# Patient Record
Sex: Male | Born: 1980 | Race: White | Hispanic: No | Marital: Married | Smoking: Former smoker
Health system: Southern US, Community
[De-identification: ages and names within clinical notes are randomized; demographics above are authoritative.]

## PROBLEM LIST (undated history)

## (undated) DIAGNOSIS — I10 Essential (primary) hypertension: Secondary | ICD-10-CM

## (undated) DIAGNOSIS — F419 Anxiety disorder, unspecified: Secondary | ICD-10-CM

## (undated) HISTORY — DX: Anxiety disorder, unspecified: F41.9

## (undated) HISTORY — DX: Essential (primary) hypertension: I10

---

## 2005-07-22 ENCOUNTER — Emergency Department (HOSPITAL_COMMUNITY): Admission: EM | Admit: 2005-07-22 | Discharge: 2005-07-22 | Payer: Self-pay | Admitting: Emergency Medicine
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
## 2013-05-23 ENCOUNTER — Ambulatory Visit (INDEPENDENT_AMBULATORY_CARE_PROVIDER_SITE_OTHER): Payer: BC Managed Care – PPO | Admitting: Family Medicine

## 2013-05-23 ENCOUNTER — Ambulatory Visit: Payer: Self-pay | Admitting: Family Medicine

## 2013-05-23 ENCOUNTER — Encounter: Payer: Self-pay | Admitting: Family Medicine

## 2013-05-23 ENCOUNTER — Encounter (INDEPENDENT_AMBULATORY_CARE_PROVIDER_SITE_OTHER): Payer: Self-pay

## 2013-05-23 VITALS — BP 134/96 | HR 90 | Temp 98.4°F | Ht 67.0 in | Wt 231.0 lb

## 2013-05-23 DIAGNOSIS — Z0000 Encounter for general adult medical examination without abnormal findings: Secondary | ICD-10-CM

## 2013-05-23 DIAGNOSIS — F411 Generalized anxiety disorder: Secondary | ICD-10-CM

## 2013-05-23 DIAGNOSIS — I10 Essential (primary) hypertension: Secondary | ICD-10-CM

## 2013-05-23 LAB — POCT CBC
Granulocyte percent: 47.4 %G (ref 37–80)
HCT, POC: 47.2 % (ref 43.5–53.7)
Hemoglobin: 15.6 g/dL (ref 14.1–18.1)
Lymph, poc: 2.5 (ref 0.6–3.4)
MCH, POC: 28.4 pg (ref 27–31.2)
MCHC: 33.1 g/dL (ref 31.8–35.4)
MCV: 85.8 fL (ref 80–97)
MPV: 9 fL (ref 0–99.8)
POC Granulocyte: 2.5 (ref 2–6.9)
POC LYMPH PERCENT: 47.5 %L (ref 10–50)
Platelet Count, POC: 149 10*3/uL (ref 142–424)
RBC: 5.5 M/uL (ref 4.69–6.13)
RDW, POC: 12.5 %
WBC: 5.2 10*3/uL (ref 4.6–10.2)

## 2013-05-23 MED ORDER — SERTRALINE HCL 50 MG PO TABS: 50.0000 mg | ORAL_TABLET | Freq: Every day | ORAL | Status: DC

## 2013-05-23 MED ORDER — LISINOPRIL 10 MG PO TABS: 10.0000 mg | ORAL_TABLET | Freq: Every day | ORAL | Status: DC

## 2013-05-23 NOTE — Patient Instructions (Signed)
Stress Stress-related medical problems are becoming increasingly common. The body has a built-in physical response to stressful situations. Faced with pressure, challenge or danger, we need to react quickly. Our bodies release hormones such as cortisol and adrenaline to help do this. These hormones are part of the "fight or flight" response and affect the metabolic rate, heart rate and blood pressure, resulting in a heightened, stressed state that prepares the body for optimum performance in dealing with a stressful situation. It is likely that early man required these mechanisms to stay alive, but usually modern stresses do not call for this, and the same hormones released in today's world can damage health and reduce coping ability. CAUSES  Pressure to perform at work, at school or in sports.  Threats of physical violence.  Money worries.  Arguments.  Family conflicts.  Divorce or separation from significant other.  Bereavement.  New job or unemployment.  Changes in location.  Alcohol or drug abuse. SOMETIMES, THERE IS NO PARTICULAR REASON FOR DEVELOPING STRESS. Almost all people are at risk of being stressed at some time in their lives. It is important to know that some stress is temporary and some is long term.  Temporary stress will go away when a situation is resolved. Most people can cope with short periods of stress, and it can often be relieved by relaxing, taking a walk, chatting through issues with friends, or having a good night's sleep.  Chronic (long-term, continuous) stress is much harder to deal with. It can be psychologically and emotionally damaging. It can be harmful both for an individual and for friends and family. SYMPTOMS Everyone reacts to stress differently. There are some common effects that help us recognize it. In times of extreme stress, people may:  Shake uncontrollably.  Breathe faster and deeper than normal (hyperventilate).  Vomit.  For people  with asthma, stress can trigger an attack.  For some people, stress may trigger migraine headaches, ulcers, and body pain. PHYSICAL EFFECTS OF STRESS MAY INCLUDE:  Loss of energy.  Skin problems.  Aches and pains resulting from tense muscles, including neck ache, backache and tension headaches.  Increased pain from arthritis and other conditions.  Irregular heart beat (palpitations).  Periods of irritability or anger.  Apathy or depression.  Anxiety (feeling uptight or worrying).  Unusual behavior.  Loss of appetite.  Comfort eating.  Lack of concentration.  Loss of, or decreased, sex-drive.  Increased smoking, drinking, or recreational drug use.  For women, missed periods.  Ulcers, joint pain, and muscle pain. Post-traumatic stress is the stress caused by any serious accident, strong emotional damage, or extremely difficult or violent experience such as rape or war. Post-traumatic stress victims can experience mixtures of emotions such as fear, shame, depression, guilt or anger. It may include recurrent memories or images that may be haunting. These feelings can last for weeks, months or even years after the traumatic event that triggered them. Specialized treatment, possibly with medicines and psychological therapies, is available. If stress is causing physical symptoms, severe distress or making it difficult for you to function as normal, it is worth seeing your caregiver. It is important to remember that although stress is a usual part of life, extreme or prolonged stress can lead to other illnesses that will need treatment. It is better to visit a doctor sooner rather than later. Stress has been linked to the development of high blood pressure and heart disease, as well as insomnia and depression. There is no diagnostic test for   stress since everyone reacts to it differently. But a caregiver will be able to spot the physical symptoms, such  as:  Headaches.  Shingles.  Ulcers. Emotional distress such as intense worry, low mood or irritability should be detected when the doctor asks pertinent questions to identify any underlying problems that might be the cause. In case there are physical reasons for the symptoms, the doctor may also want to do some tests to exclude certain conditions. If you feel that you are suffering from stress, try to identify the aspects of your life that are causing it. Sometimes you may not be able to change or avoid them, but even a small change can have a positive ripple effect. A simple lifestyle change can make all the difference. STRATEGIES THAT CAN HELP DEAL WITH STRESS:  Delegating or sharing responsibilities.  Avoiding confrontations.  Learning to be more assertive.  Regular exercise.  Avoid using alcohol or street drugs to cope.  Eating a healthy, balanced diet, rich in fruit and vegetables and proteins.  Finding humor or absurdity in stressful situations.  Never taking on more than you know you can handle comfortably.  Organizing your time better to get as much done as possible.  Talking to friends or family and sharing your thoughts and fears.  Listening to music or relaxation tapes.  Tensing and then relaxing your muscles, starting at the toes and working up to the head and neck. If you think that you would benefit from help, either in identifying the things that are causing your stress or in learning techniques to help you relax, see a caregiver who is capable of helping you with this. Rather than relying on medications, it is usually better to try and identify the things in your life that are causing stress and try to deal with them. There are many techniques of managing stress including counseling, psychotherapy, aromatherapy, yoga, and exercise. Your caregiver can help you determine what is best for you. Document Released: 10/08/2002 Document Revised: 10/10/2011 Document  Reviewed: 09/04/2007 [employer] Patient Information 2014 [employer].  

## 2013-05-23 NOTE — Progress Notes (Signed)
  Subjective:    Patient ID: Daniel Stephens, male    DOB: 07-Jul-1981, 32 y.o.   MRN: [id number]  HPI This 32 y.o. male presents for evaluation of elevated bp and stress levels are high. He finds that he has difficulty relaxing and relives his job duties over and over again. He has been having elevated blood pressure readings.  He has not had labs in awhile and Is due for annual labs   Review of Systems C/o anxiety and stress. No chest pain, SOB, HA, dizziness, vision change, N/V, diarrhea, constipation, dysuria, urinary urgency or frequency, myalgias, arthralgias or rash.     Objective:   Physical Exam Vital signs noted  Well developed well nourished male.  HEENT - Head atraumatic Normocephalic                Eyes - PERRLA, Conjuctiva - clear Sclera- Clear EOMI                Ears - EAC's Wnl TM's Wnl Gross Hearing WNL                Nose - Nares patent                 Throat - oropharanx wnl Respiratory - Lungs CTA bilateral Cardiac - RRR S1 and S2 without murmur GI - Abdomen soft Nontender and bowel sounds active x 4 Extremities - No edema. Neuro - Grossly intact.       Assessment & Plan:  Essential hypertension, benign - Plan: lisinopril (PRINIVIL,ZESTRIL) 10 MG tablet  Anxiety state, unspecified - Plan: sertraline (ZOLOFT) 50 MG tablet  Routine general medical examination at a health care facility - Plan: POCT CBC, Lipid panel, CMP14+EGFR, Thyroid Panel With TSH, Vit D  25 hydroxy (rtn osteoporosis monitoring). Discussed exercising and follow up in one month.  Deatra Canter FNP

## 2013-05-24 LAB — CMP14+EGFR
ALT: 41 IU/L (ref 0–44)
AST: 23 IU/L (ref 0–40)
Albumin/Globulin Ratio: 2.1 (ref 1.1–2.5)
Albumin: 4.9 g/dL (ref 3.5–5.5)
Alkaline Phosphatase: 63 IU/L (ref 39–117)
BUN/Creatinine Ratio: 9 (ref 8–19)
BUN: 8 mg/dL (ref 6–20)
CO2: 23 mmol/L (ref 18–29)
Calcium: 9.9 mg/dL (ref 8.7–10.2)
Chloride: 102 mmol/L (ref 97–108)
Creatinine, Ser: 0.92 mg/dL (ref 0.76–1.27)
GFR calc Af Amer: 129 mL/min/{1.73_m2} (ref >59)
GFR calc non Af Amer: 111 mL/min/{1.73_m2} (ref >59)
Globulin, Total: 2.3 g/dL (ref 1.5–4.5)
Glucose: 90 mg/dL (ref 65–99)
Potassium: 4.6 mmol/L (ref 3.5–5.2)
Sodium: 141 mmol/L (ref 134–144)
Total Bilirubin: 0.4 mg/dL (ref 0.0–1.2)
Total Protein: 7.2 g/dL (ref 6.0–8.5)

## 2013-05-24 LAB — THYROID PANEL WITH TSH
Free Thyroxine Index: 2.6 (ref 1.2–4.9)
T3 Uptake Ratio: 30 % (ref 24–39)
T4, Total: 8.6 ug/dL (ref 4.5–12.0)
TSH: 1.25 u[IU]/mL (ref 0.450–4.500)

## 2013-05-24 LAB — LIPID PANEL
Chol/HDL Ratio: 6.2 ratio units — ABNORMAL HIGH (ref 0.0–5.0)
Cholesterol, Total: 193 mg/dL (ref 100–199)
HDL: 31 mg/dL — ABNORMAL LOW (ref >39)
LDL Calculated: 144 mg/dL — ABNORMAL HIGH (ref 0–99)
Triglycerides: 88 mg/dL (ref 0–149)
VLDL Cholesterol Cal: 18 mg/dL (ref 5–40)

## 2013-05-24 LAB — VITAMIN D 25 HYDROXY (VIT D DEFICIENCY, FRACTURES): Vit D, 25-Hydroxy: 16.8 ng/mL — ABNORMAL LOW (ref 30.0–100.0)

## 2013-06-24 ENCOUNTER — Other Ambulatory Visit: Payer: Self-pay | Admitting: Family Medicine

## 2013-06-24 ENCOUNTER — Ambulatory Visit (INDEPENDENT_AMBULATORY_CARE_PROVIDER_SITE_OTHER): Payer: BC Managed Care – PPO | Admitting: Family Medicine

## 2013-06-24 ENCOUNTER — Encounter: Payer: Self-pay | Admitting: Family Medicine

## 2013-06-24 VITALS — BP 127/84 | HR 66 | Temp 98.8°F | Ht 67.0 in | Wt 225.0 lb

## 2013-06-24 DIAGNOSIS — F411 Generalized anxiety disorder: Secondary | ICD-10-CM

## 2013-06-24 DIAGNOSIS — E559 Vitamin D deficiency, unspecified: Secondary | ICD-10-CM | POA: Insufficient documentation

## 2013-06-24 DIAGNOSIS — E785 Hyperlipidemia, unspecified: Secondary | ICD-10-CM

## 2013-06-24 DIAGNOSIS — I10 Essential (primary) hypertension: Secondary | ICD-10-CM | POA: Insufficient documentation

## 2013-06-24 MED ORDER — LISINOPRIL 10 MG PO TABS: 10.0000 mg | ORAL_TABLET | Freq: Every day | ORAL | Status: DC

## 2013-06-24 MED ORDER — SERTRALINE HCL 50 MG PO TABS: 50.0000 mg | ORAL_TABLET | Freq: Every day | ORAL | Status: DC

## 2013-06-24 MED ORDER — VITAMIN D (ERGOCALCIFEROL) 1.25 MG (50000 UNIT) PO CAPS: 50000.0000 [IU] | ORAL_CAPSULE | ORAL | Status: AC

## 2013-06-24 NOTE — Patient Instructions (Signed)
Vitamin D Deficiency  Vitamin D is an important vitamin that your body needs. Having too little of it in your body is called a deficiency. A very bad deficiency can make your bones soft and can cause a condition called rickets.   Vitamin D is important to your body for different reasons, such as:   · It helps your body absorb 2 minerals called calcium and phosphorus.  · It helps make your bones healthy.  · It may prevent some diseases, such as diabetes and multiple sclerosis.  · It helps your muscles and heart.  You can get vitamin D in several ways. It is a natural part of some foods. The vitamin is also added to some dairy products and cereals. Some people take vitamin D supplements. Also, your body makes vitamin D when you are in the sun. It changes the sun's rays into a form of the vitamin that your body can use.  CAUSES   · Not eating enough foods that contain vitamin D.  · Not getting enough sunlight.  · Having certain digestive system diseases that make it hard to absorb vitamin D. These diseases include Crohn's disease, chronic pancreatitis, and cystic fibrosis.  · Having a surgery in which part of the stomach or small intestine is removed.  · Being obese. Fat cells pull vitamin D out of your blood. That means that obese people may not have enough vitamin D left in their blood and in other body tissues.  · Having chronic kidney or liver disease.  RISK FACTORS  Risk factors are things that make you more likely to develop a vitamin D deficiency. They include:  · Being older.  · Not being able to get outside very much.  · Living in a nursing home.  · Having had broken bones.  · Having weak or thin bones (osteoporosis).  · Having a disease or condition that changes how your body absorbs vitamin D.  · Having dark skin.  · Some medicines such as seizure medicines or steroids.  · Being overweight or obese.  SYMPTOMS  Mild cases of vitamin D deficiency may not have any symptoms. If you have a very bad case, symptoms  may include:  · Bone pain.  · Muscle pain.  · Falling often.  · Broken bones caused by a minor injury, due to osteoporosis.  DIAGNOSIS  A blood test is the best way to tell if you have a vitamin D deficiency.  TREATMENT  Vitamin D deficiency can be treated in different ways. Treatment for vitamin D deficiency depends on what is causing it. Options include:  · Taking vitamin D supplements.  · Taking a calcium supplement. Your caregiver will suggest what dose is best for you.  HOME CARE INSTRUCTIONS  · Take any supplements that your caregiver prescribes. Follow the directions carefully. Take only the suggested amount.  · Have your blood tested 2 months after you start taking supplements.  · Eat foods that contain vitamin D. Healthy choices include:  · Fortified dairy products, cereals, or juices. Fortified means vitamin D has been added to the food. Check the label on the package to be sure.  · Fatty fish like salmon or trout.  · Eggs.  · Oysters.  · Do not use a tanning bed.  · Keep your weight at a healthy level. Lose weight if you need to.  · Keep all follow-up appointments. Your caregiver will need to perform blood tests to make sure your vitamin D deficiency   is going away.  SEEK MEDICAL CARE IF:  · You have any questions about your treatment.  · You continue to have symptoms of vitamin D deficiency.  · You have nausea or vomiting.  · You are constipated.  · You feel confused.  · You have severe abdominal or back pain.  MAKE SURE YOU:  · Understand these instructions.  · Will watch your condition.  · Will get help right away if you are not doing well or get worse.  Document Released: 10/10/2011 Document Revised: 11/12/2012 Document Reviewed: 10/10/2011  [employer]® Patient Information ©2014 [employer].

## 2013-06-24 NOTE — Progress Notes (Signed)
  Subjective:    Patient ID: Daniel Stephens, male    DOB: 10/01/1980, 32 y.o.   MRN: [id number]  HPI This 32 y.o. male presents for evaluation of anxiety and htn.  He is feeling better And not obsessed as much.  He does feel tired.  He feels like he has to sleep a lot And feels tired during the day.  His blood pressure is better controlled on the lisinopril and he Is tolerating it well.  He has had labs and his labs showed elevated lipids and low vitamin D.   Review of Systems    No chest pain, SOB, HA, dizziness, vision change, N/V, diarrhea, constipation, dysuria, urinary urgency or frequency, myalgias, arthralgias or rash.  Objective:   Physical Exam Vital signs noted  Well developed well nourished male.  HEENT - Head atraumatic Normocephalic                Eyes - PERRLA, Conjuctiva - clear Sclera- Clear EOMI                Ears - EAC's Wnl TM's Wnl Gross Hearing WNL                Nose - Nares patent                 Throat - oropharanx wnl Respiratory - Lungs CTA bilateral Cardiac - RRR S1 and S2 without murmur GI - Abdomen soft Nontender and bowel sounds active x 4 Extremities - No edema. Neuro - Grossly intact.       Assessment & Plan:  Anxiety state, unspecified - Plan: sertraline (ZOLOFT) 50 MG tablet  Essential hypertension, benign - Plan: lisinopril (PRINIVIL,ZESTRIL) 10 MG tablet  Vitamin D deficiency - Vitamin D 50,000 iu po qweek x 18 weeks and take Vitamin D 1000 iu po qd otc.  Hyperlipidemia - fish oil otc as directed.    Deatra Canter FNP

## 2013-12-24 ENCOUNTER — Ambulatory Visit: Payer: BC Managed Care – PPO | Admitting: Family Medicine

## 2014-08-12 ENCOUNTER — Telehealth: Payer: Self-pay | Admitting: Family Medicine

## 2014-08-12 ENCOUNTER — Other Ambulatory Visit: Payer: Self-pay | Admitting: Family Medicine

## 2014-08-13 ENCOUNTER — Other Ambulatory Visit: Payer: Self-pay | Admitting: Family Medicine

## 2014-08-13 DIAGNOSIS — F411 Generalized anxiety disorder: Secondary | ICD-10-CM

## 2014-08-13 DIAGNOSIS — I10 Essential (primary) hypertension: Secondary | ICD-10-CM

## 2014-08-13 MED ORDER — LISINOPRIL 10 MG PO TABS: 10.0000 mg | ORAL_TABLET | Freq: Every day | ORAL | Status: AC

## 2014-08-13 MED ORDER — SERTRALINE HCL 50 MG PO TABS: 50.0000 mg | ORAL_TABLET | Freq: Every day | ORAL | Status: AC

## 2014-08-13 NOTE — Telephone Encounter (Signed)
meds refilled
